# Patient Record
Sex: Male | Born: 1955 | Race: White | Hispanic: No | Marital: Married | Smoking: Never smoker
Health system: Southern US, Community
[De-identification: ages and names within clinical notes are randomized; demographics above are authoritative.]

---

## 2004-09-22 ENCOUNTER — Emergency Department: Payer: Self-pay | Admitting: Emergency Medicine

## 2004-09-22 ENCOUNTER — Other Ambulatory Visit: Payer: Self-pay

## 2004-09-23 ENCOUNTER — Other Ambulatory Visit: Payer: Self-pay

## 2004-09-23 ENCOUNTER — Emergency Department: Payer: Self-pay | Admitting: Emergency Medicine

## 2006-09-11 IMAGING — CT CT ABD-PELV W/O CM
1 of 3 series · 14 of 32 positions shown, 18 images · non-contrast
Comparison: none

REASON FOR EXAM: (1) stone protocol  rm8; (2) stone protocol  rm8
COMMENTS:

[Series 3: inspace · axial · 0.75mm/px · z∈[-1634,-1183]mm · 14 of 705 slices shown, 18 images]
[im 30/705  soft-tissue]
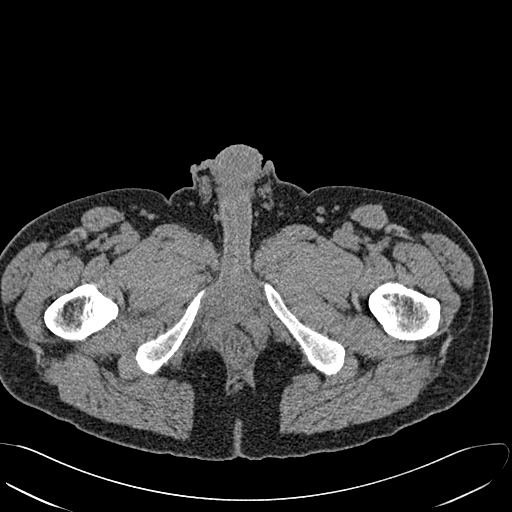
[im 30/705  bone]
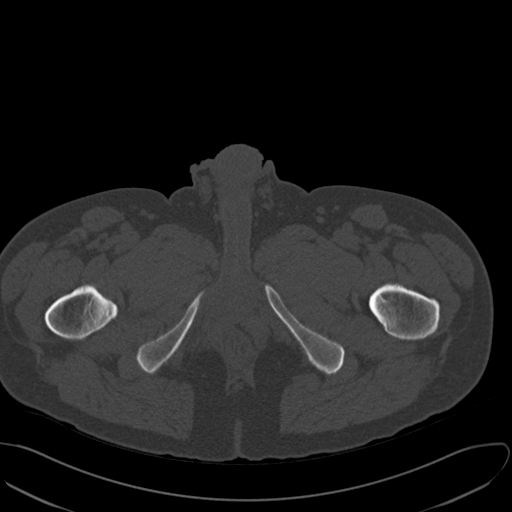
[im 89/705  soft-tissue]
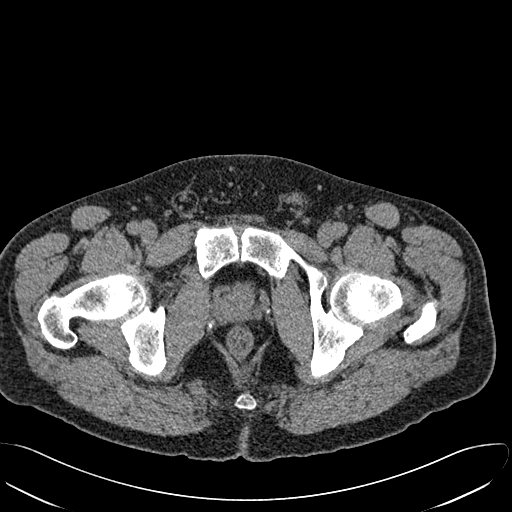
[im 147/705  soft-tissue]
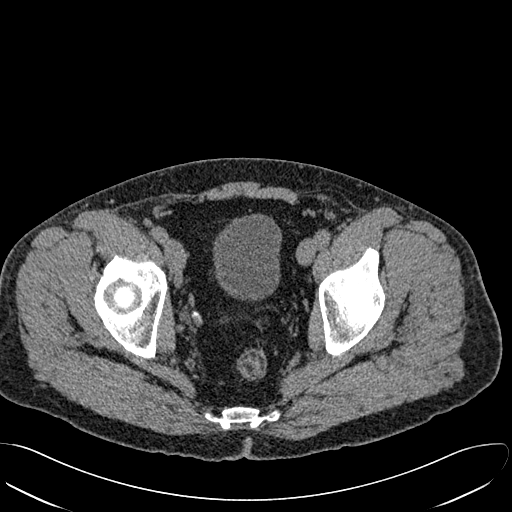
[im 206/705  soft-tissue]
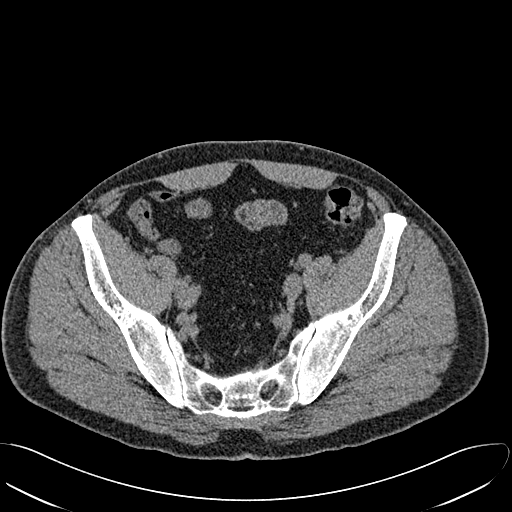
[im 265/705  soft-tissue]
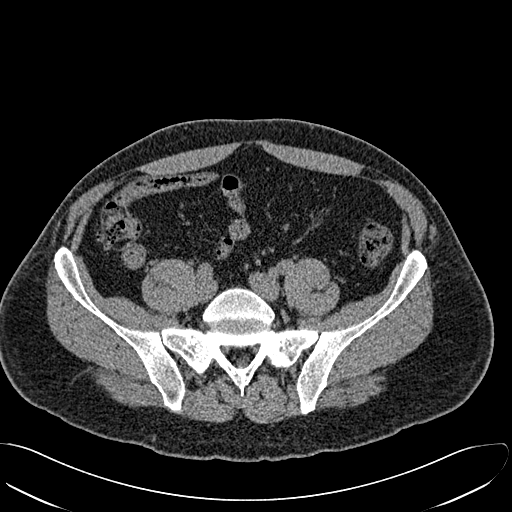
[im 323/705  soft-tissue]
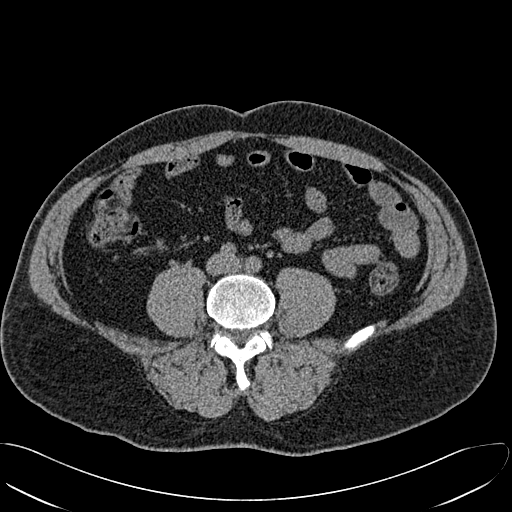
[im 382/705  soft-tissue]
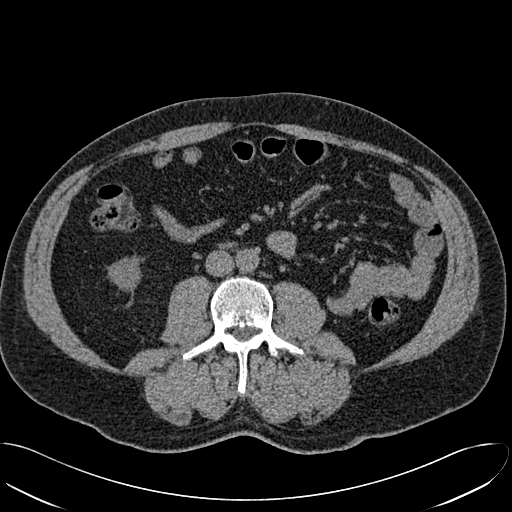
[im 441/705  soft-tissue]
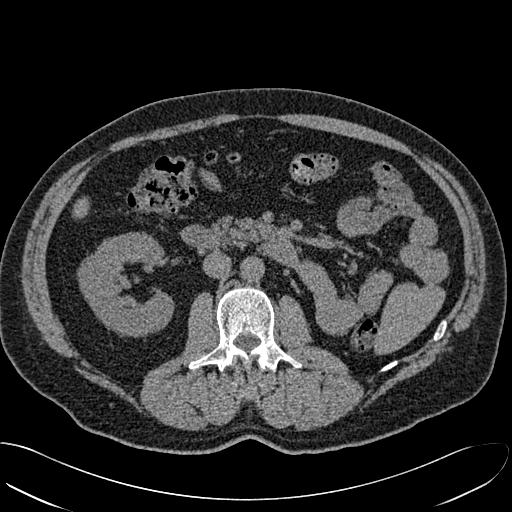
[im 499/705  soft-tissue]
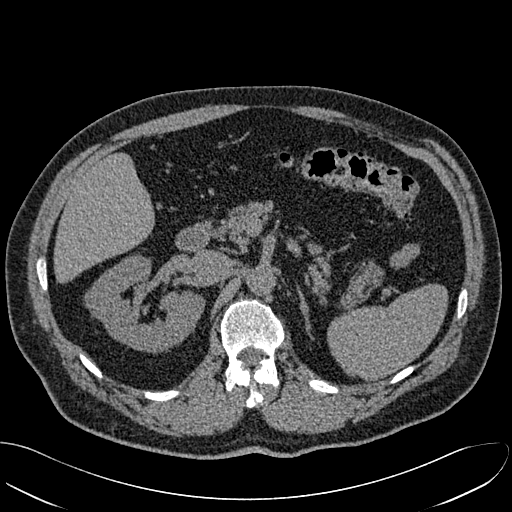
[im 499/705  bone]
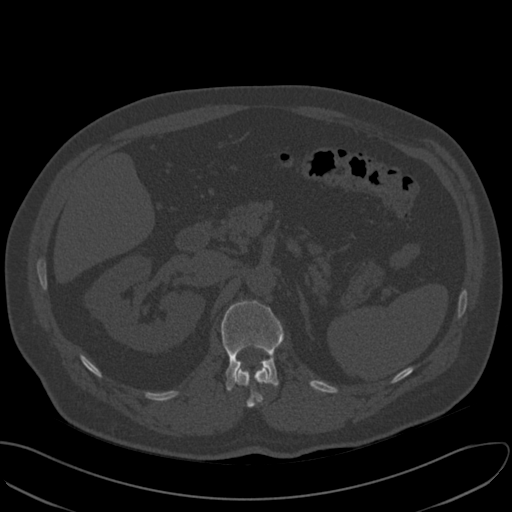
[im 558/705  soft-tissue]
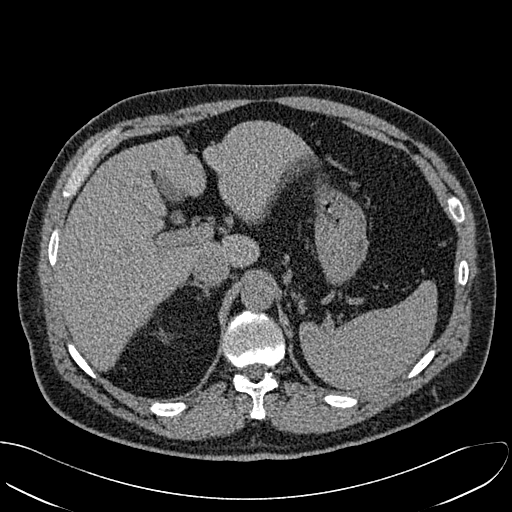
[im 587/705  lung]
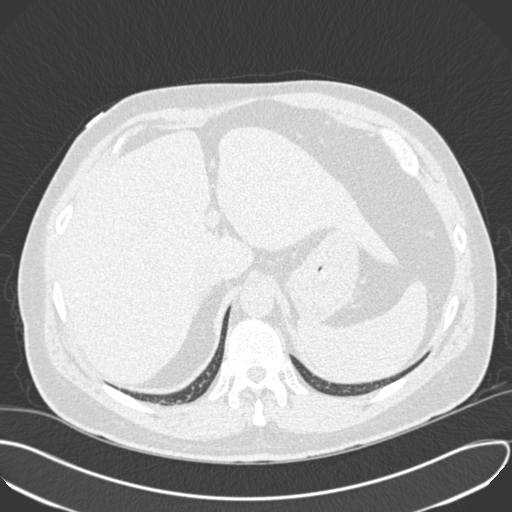
[im 617/705  soft-tissue]
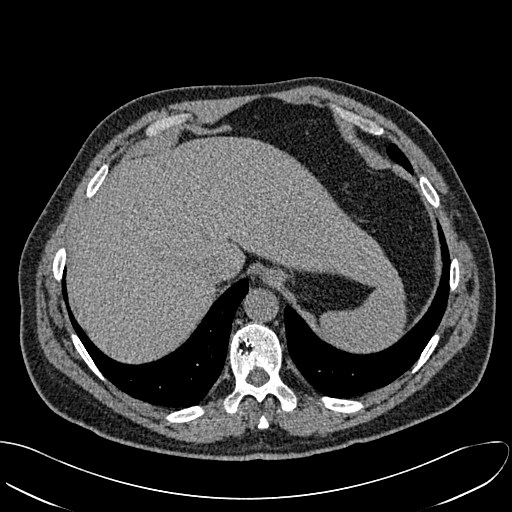
[im 617/705  lung]
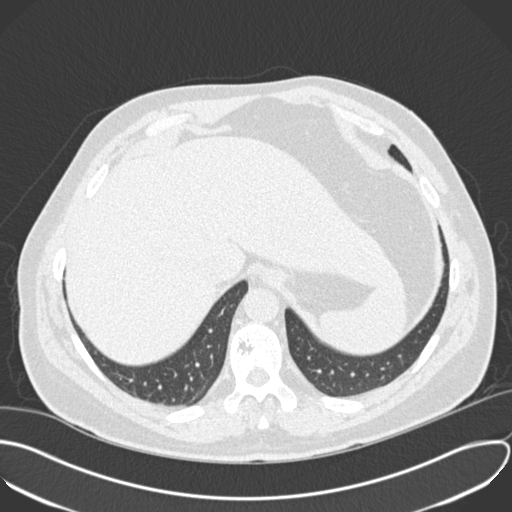
[im 646/705  lung]
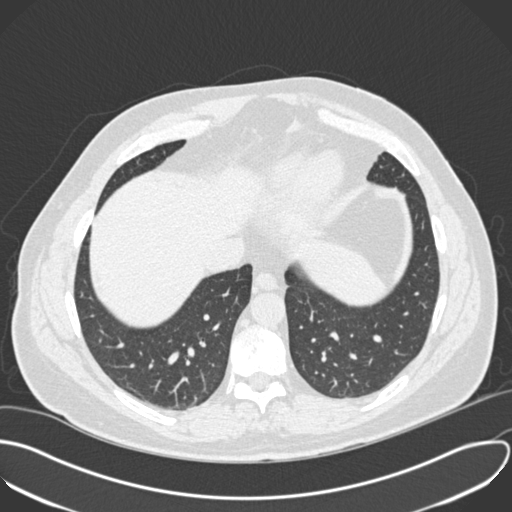
[im 675/705  soft-tissue]
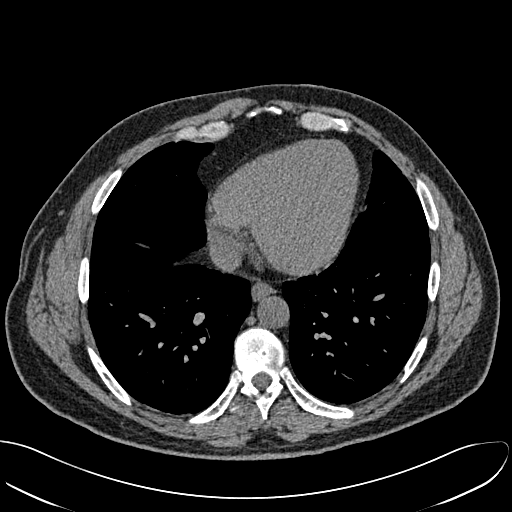
[im 675/705  lung]
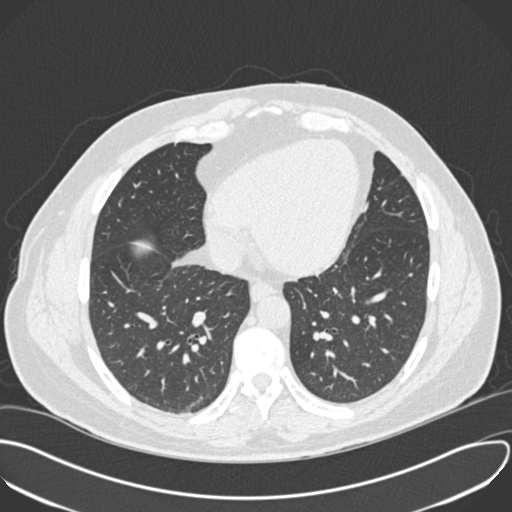

[14 of 32 positions shown; findings below may reference images not displayed]

PROCEDURE:     CT  - CT ABDOMEN AND PELVIS W[DATE]  [DATE]

RESULT:       The study was tailored to evaluate the patient for urinary
tract stones.
The LEFT kidney is absent.  I see no surgical clips;the kidney may be a
congenitally absent.  The LEFT renal fossa is occupied by spleen and a
portion of the pancreas and loops of small bowel.  The adrenal gland appears
larger on the LEFT than on the RIGHT but it is difficult to clearly specify
its margins.  There are prominent splenic vessels adjacent to it.  The RIGHT
kidney exhibits no evidence of hydronephrosis nor of calcified stones.  The
RIGHT ureter appears normal in course and caliber.  A tiny calcific density
in the midline along the extreme anterior superior aspect of the urinary
bladder likely reflects a urachal remnant.  The urinary bladder is partially
distended and grossly normal.  There are phleboliths within the pelvis.  The
prostate gland and seminal vesicles are grossly normal.  No free fluid is
seen in the pelvis. The sigmoid colon is normal in appearance.  The
unopacified loops of small and large bowel appear normal elsewhere as well.

The liver, gallbladder, spleen, pancreas, nondistended stomach, and RIGHT
adrenal gland are normal in appearance.  The para-aortic and pericaval
regions exhibit no finding to suggest lymphadenopathy.  There is no evidence
of an abdominal aortic aneurysm.  At lung window settings, I see no
infiltrate nor atelectasis.
IMPRESSION: 1.     The LEFT kidney is absent.
2.     The RIGHT kidney exhibits compensatory hypertrophy and no evidence of
stones or hydronephrosis nor findings to suggest solid masses.
3.     I see no evidence of hydronephrosis on the RIGHT or hydroureter.
4.     A tiny urachal remnant is present in the midline just above the
anterior aspect of the dome of the urinary bladder.

A preliminary report was faxed to the Emergency Room by Dr. Franz Daniel Avila Fernandez of
the [HOSPITAL]  Group at the conclusion of the study.

## 2011-05-23 ENCOUNTER — Ambulatory Visit: Payer: Self-pay | Admitting: Surgery

## 2012-12-14 IMAGING — US ULTRASOUND RIGHT BREAST
1 series · 8 of 8 positions shown · non-contrast
Comparison: none

REASON FOR EXAM: rt breast nodule below nipple
COMMENTS:

PROCEDURE:     US  - US BREAST RIGHT  - May 23, 2011  [DATE]
RESULT:

[Series 1: ultrasound right breast · 8 of 8 slices shown]
[im 1/8]
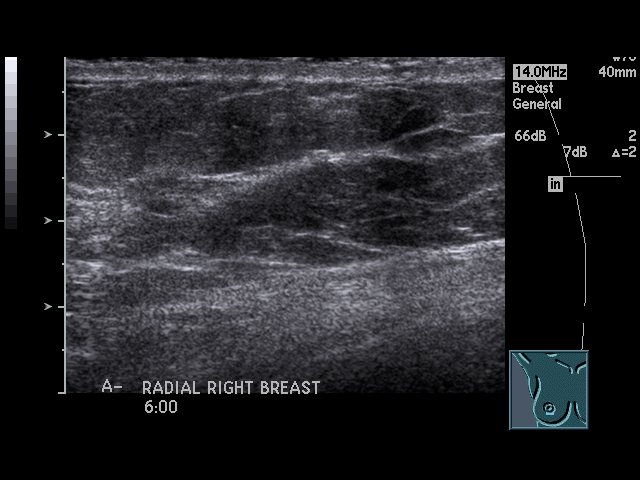
[im 2/8]
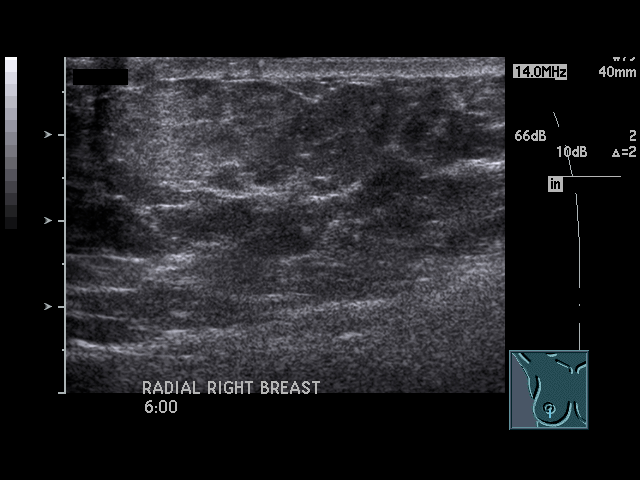
[im 3/8]
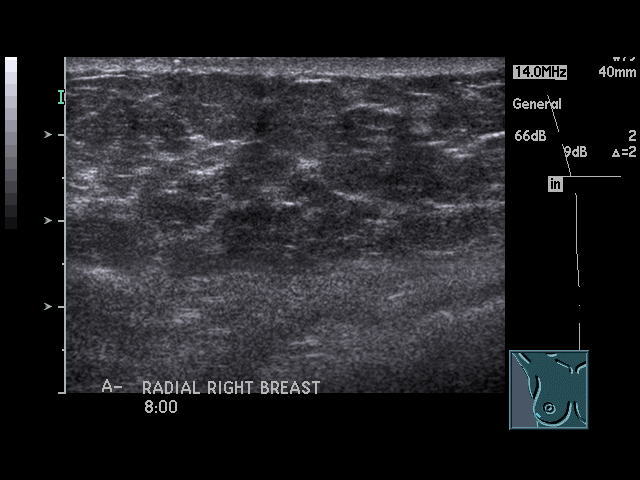
[im 4/8]
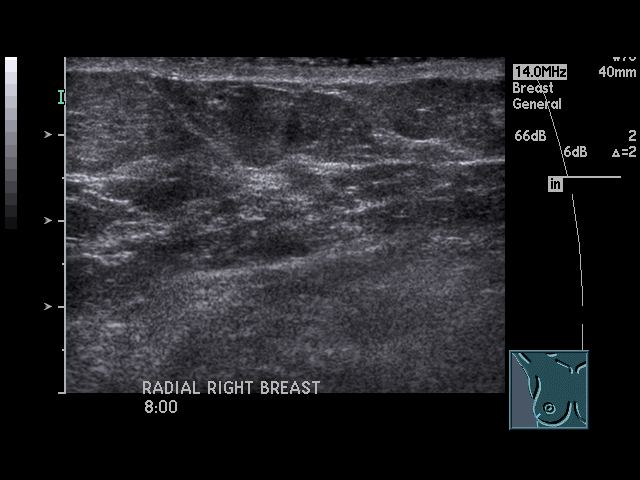
[im 5/8]
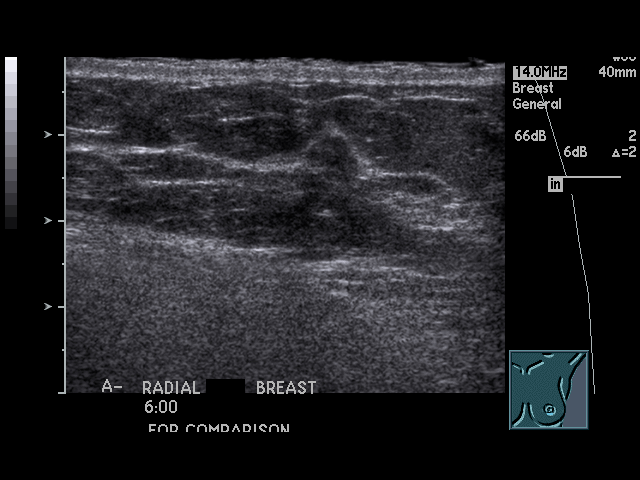
[im 6/8]
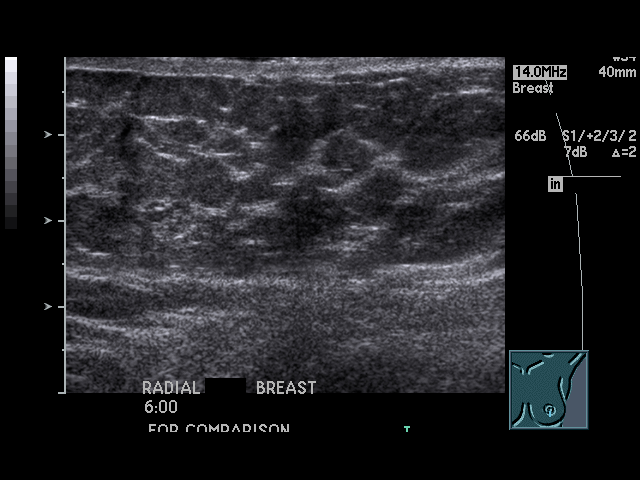
[im 7/8]
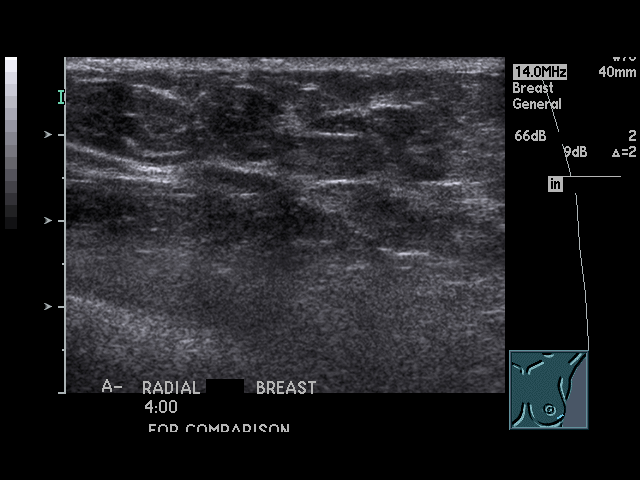
[im 8/8]
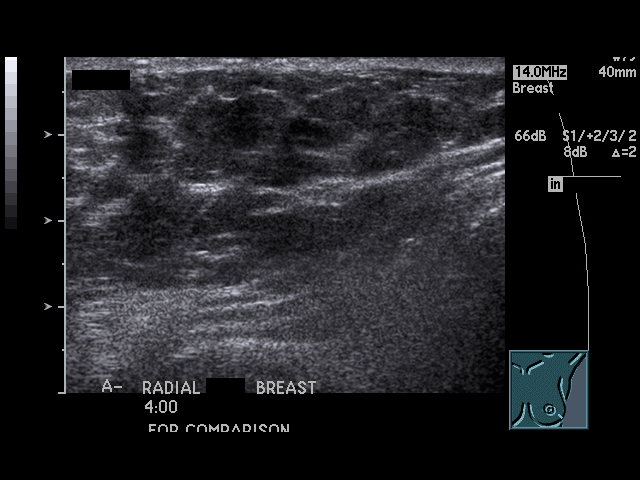

[8 of 8 positions shown; findings below may reference images not displayed]

FINDINGS: Targeted Ultrasound of the right breast is performed in the 6
o'clock and 8 o'clock regions. No solid or cystic abnormality is present.
Comparative sonographic imaging of the left breast shows similar parenchymal
density.
IMPRESSION: No focal sonographic abnormality evident.

## 2013-09-10 ENCOUNTER — Encounter: Payer: Self-pay | Admitting: Urology

## 2013-09-10 LAB — HEMOGLOBIN: HGB: 17.1 g/dL (ref 13.0–18.0)

## 2013-09-10 LAB — HEMATOCRIT: HCT: 50.3 % (ref 40.0–52.0)

## 2013-09-19 ENCOUNTER — Encounter: Payer: Self-pay | Admitting: Urology

## 2014-04-15 DIAGNOSIS — K219 Gastro-esophageal reflux disease without esophagitis: Secondary | ICD-10-CM | POA: Insufficient documentation

## 2014-04-15 DIAGNOSIS — E349 Endocrine disorder, unspecified: Secondary | ICD-10-CM | POA: Insufficient documentation

## 2014-04-15 DIAGNOSIS — E785 Hyperlipidemia, unspecified: Secondary | ICD-10-CM | POA: Insufficient documentation

## 2014-04-15 DIAGNOSIS — I10 Essential (primary) hypertension: Secondary | ICD-10-CM | POA: Insufficient documentation

## 2014-07-11 DIAGNOSIS — M1000 Idiopathic gout, unspecified site: Secondary | ICD-10-CM | POA: Insufficient documentation

## 2016-12-26 ENCOUNTER — Emergency Department
Admission: EM | Admit: 2016-12-26 | Discharge: 2016-12-26 | Disposition: A | Discharge status: Home / Self Care | Payer: BC Managed Care – PPO | Attending: Emergency Medicine | Admitting: Emergency Medicine

## 2016-12-26 ENCOUNTER — Encounter: Payer: Self-pay | Admitting: *Deleted

## 2016-12-26 DIAGNOSIS — I10 Essential (primary) hypertension: Secondary | ICD-10-CM

## 2016-12-26 LAB — CBC
HCT: 48.7 % (ref 40.0–52.0)
Hemoglobin: 16.9 g/dL (ref 13.0–18.0)
MCH: 33.8 pg (ref 26.0–34.0)
MCHC: 34.6 g/dL (ref 32.0–36.0)
MCV: 97.6 fL (ref 80.0–100.0)
Platelets: 178 10*3/uL (ref 150–440)
RBC: 4.99 MIL/uL (ref 4.40–5.90)
RDW: 13.7 % (ref 11.5–14.5)
WBC: 7.6 10*3/uL (ref 3.8–10.6)

## 2016-12-26 LAB — BASIC METABOLIC PANEL
Anion gap: 11 (ref 5–15)
BUN: 18 mg/dL (ref 6–20)
CO2: 27 mmol/L (ref 22–32)
Calcium: 9.6 mg/dL (ref 8.9–10.3)
Chloride: 100 mmol/L — ABNORMAL LOW (ref 101–111)
Creatinine, Ser: 1.56 mg/dL — ABNORMAL HIGH (ref 0.61–1.24)
GFR calc Af Amer: 54 mL/min — ABNORMAL LOW (ref >60)
GFR calc non Af Amer: 47 mL/min — ABNORMAL LOW (ref >60)
Glucose, Bld: 142 mg/dL — ABNORMAL HIGH (ref 65–99)
Potassium: 3.7 mmol/L (ref 3.5–5.1)
Sodium: 138 mmol/L (ref 135–145)

## 2016-12-26 LAB — BRAIN NATRIURETIC PEPTIDE: B Natriuretic Peptide: 54 pg/mL (ref 0.0–100.0)

## 2016-12-26 LAB — TROPONIN I: Troponin I: <0.03 ng/mL (ref <0.03)

## 2016-12-26 MED ORDER — METOPROLOL TARTRATE 50 MG PO TABS
50.0000 mg | ORAL_TABLET | Freq: Once | ORAL | Status: AC
  Administered 2016-12-26: 50 mg via ORAL
  Filled 2016-12-26: qty 1

## 2016-12-26 MED ORDER — METOPROLOL TARTRATE 50 MG PO TABS: 50.0000 mg | ORAL_TABLET | Freq: Two times a day (BID) | ORAL | 0 refills | Status: DC

## 2016-12-26 NOTE — Discharge Instructions (Signed)
Stop taking the toprol and switch to lopressor 50mg  twice a day. Take this dose for a few days and if your blood pressure is still elevated talk to your doctor about increasing the dosage. Keep a diary with your blood pressure and heart rate. Return to the ER of you develop chest pain, shortness of breath, severe headache or any other new symptoms concerning to you.

## 2016-12-26 NOTE — ED Triage Notes (Signed)
Pt reports high blood pressure and swollen feet.  Sx began yesterday.  Pt taking bp meds.  No chest pain or sob. Pt alert.

## 2016-12-26 NOTE — ED Provider Notes (Signed)
[facility] Emergency Department Provider Note  ____________________________________________  Time seen: Approximately 8:01 PM  I have reviewed the triage vital signs and the nursing notes.   HISTORY  Chief Complaint Hypertension   HPI AVISH TORRY is a 61 y.o. male with history of hypertension, hyperlipidemia, diabetes, and GERD who presents for evaluation of elevated blood pressure. Patient went to see his primary care doctor yesterday for elevated blood pressure and lower extremity edema. No changes on his medications were done. Doctor wanted patient to check his blood pressure for 2 weeks before making any changes to patient's current regimen.Patient reports that today he wasn't feeling well had some pressure in his head and tightness in his chest which prompted him to check his blood pressure. 2 reads at home that were above 314 systolic. He has been compliant with his antihypertensives. He denies any headache or chest pain at this time, no shortness of breath, no orthopnea. Patient reports that he stands on his feet 12 hours a day for work and has had progressively worsening swelling of his lower extremities for the last week. He denies any history of heart failure.  No past medical history on file.  There are no active problems to display for this patient.   No past surgical history on file.  Prior to Admission medications   Medication Sig Start Date End Date Taking? Authorizing Provider  metoprolol tartrate (LOPRESSOR) 50 MG tablet Take 1 tablet (50 mg total) by mouth 2 (two) times daily. 12/26/16 12/26/17  Rudene Re, MD    Allergies Penicillins  No family history on file.  Social History Social History  Substance Use Topics  . Smoking status: Never Smoker  . Smokeless tobacco: Never Used  . Alcohol use No    Review of Systems  Constitutional: Negative for fever. Eyes: Negative for visual changes. ENT: Negative for sore  throat. Neck: No neck pain  Cardiovascular: Negative for chest pain. Respiratory: Negative for shortness of breath. Gastrointestinal: Negative for abdominal pain, vomiting or diarrhea. Genitourinary: Negative for dysuria. Musculoskeletal: Negative for back pain. + b/l LE edema Skin: Negative for rash. Neurological: Negative for headaches, weakness or numbness. Psych: No SI or HI  ____________________________________________   PHYSICAL EXAM:  VITAL SIGNS: ED Triage Vitals  Enc Vitals Group     BP 12/26/16 1910 (!) 207/117     Pulse Rate 12/26/16 1910 94     Resp 12/26/16 1910 20     Temp 12/26/16 1910 99.1 F (37.3 C)     Temp Source 12/26/16 1910 Oral     SpO2 12/26/16 1910 99 %     Weight 12/26/16 1907 237 lb (107.5 kg)     Height 12/26/16 1907 5\' 9"  (1.753 m)     Head Circumference --      Peak Flow --      Pain Score --      Pain Loc --      Pain Edu? --      Excl. in [place]? --     Constitutional: Alert and oriented. Well appearing and in no apparent distress. HEENT:      Head: Normocephalic and atraumatic.         Eyes: Conjunctivae are normal. Sclera is non-icteric. EOMI. PERRL      Mouth/Throat: Mucous membranes are moist.       Neck: Supple with no signs of meningismus. Cardiovascular: Regular rate and rhythm. No murmurs, gallops, or rubs. 2+ symmetrical distal pulses are present  in all extremities. No JVD. Respiratory: Normal respiratory effort. Lungs are clear to auscultation bilaterally. No wheezes, crackles, or rhonchi.  Gastrointestinal: Soft, non tender, and non distended with positive bowel sounds. No rebound or guarding. Musculoskeletal: 2+ pitting edema on b/l LE Neurologic: Normal speech and language. A & O x3, PERRL, no nystagmus, CN II-XII intact, motor testing reveals good tone and bulk throughout. There is no evidence of pronator drift or dysmetria. Muscle strength is 5/5 throughout. Deep tendon reflexes are 2+ throughout with downgoing toes. Sensory  examination is intact. Gait is normal. Skin: Skin is warm, dry and intact. No rash noted. Psychiatric: Mood and affect are normal. Speech and behavior are normal.  ____________________________________________   LABS (all labs ordered are listed, but only abnormal results are displayed)  Labs Reviewed  BASIC METABOLIC PANEL - Abnormal; Notable for the following:       Result Value   Chloride 100 (*)    Glucose, Bld 142 (*)    Creatinine, Ser 1.56 (*)    GFR calc non Af Amer 47 (*)    GFR calc Af Amer 54 (*)    All other components within normal limits  CBC  BRAIN NATRIURETIC PEPTIDE  TROPONIN I   ____________________________________________  EKG  ED ECG REPORT I, Rudene Re, the attending physician, personally viewed and interpreted this ECG.  Sinus rhythm, rate of 92, normal intervals, normal axis, no ST elevations or depressions. ____________________________________________  RADIOLOGY  none ____________________________________________   PROCEDURES  Procedure(s) performed: None Procedures Critical Care performed:  None ____________________________________________   INITIAL IMPRESSION / ASSESSMENT AND PLAN / ED COURSE   61 y.o. male with history of hypertension, hyperlipidemia, diabetes, and GERD who presents for evaluation of elevated blood pressure and b/l LE edema. Patient does have 2+ pitting edema but no other evidence of heart failure with clear lungs and normal JVD. We'll check a BNP. Patient is currently on benazepril, Toprol 100 mg, and amlodipine. 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 Will switch patient from toprol 100mg  QD to lopressor 50mg  BID as patient's HR has room for increased beta blockade  Clinical Course as of Dec 26 2013  Thu Dec 26, 2016  2011 Labs and no acute findings. Patient's creatinine is at his baseline of 1.5, BNP is negative. Troponin is negative.  Plan to discharge home with new beta blocker regimen and close follow-up with primary care doctor.  [CV]    Clinical Course User Index [CV] Rudene Re, MD    Pertinent labs & imaging results that were available during my care of the patient were reviewed by me and considered in my medical decision making (see chart for details).    ____________________________________________   FINAL CLINICAL IMPRESSION(S) / ED DIAGNOSES  Final diagnoses:  Essential hypertension      NEW MEDICATIONS STARTED DURING THIS VISIT:  New Prescriptions   METOPROLOL TARTRATE (LOPRESSOR) 50 MG TABLET    Take 1 tablet (50 mg total) by mouth 2 (two) times daily.     Note:  This document was prepared using Dragon voice recognition software and may include unintentional dictation errors.    Rudene Re, MD 12/26/16 Thom Chimes

## 2017-01-10 ENCOUNTER — Other Ambulatory Visit (INDEPENDENT_AMBULATORY_CARE_PROVIDER_SITE_OTHER): Payer: Self-pay | Admitting: Family Medicine

## 2017-01-10 DIAGNOSIS — I10 Essential (primary) hypertension: Secondary | ICD-10-CM

## 2017-01-14 ENCOUNTER — Ambulatory Visit (INDEPENDENT_AMBULATORY_CARE_PROVIDER_SITE_OTHER): Payer: BC Managed Care – PPO

## 2017-01-14 DIAGNOSIS — I10 Essential (primary) hypertension: Secondary | ICD-10-CM

## 2021-01-31 DIAGNOSIS — Z85828 Personal history of other malignant neoplasm of skin: Secondary | ICD-10-CM | POA: Insufficient documentation

## 2021-01-31 DIAGNOSIS — Z9889 Other specified postprocedural states: Secondary | ICD-10-CM | POA: Insufficient documentation

## 2021-03-13 DIAGNOSIS — N289 Disorder of kidney and ureter, unspecified: Secondary | ICD-10-CM | POA: Insufficient documentation

## 2021-03-13 DIAGNOSIS — N401 Enlarged prostate with lower urinary tract symptoms: Secondary | ICD-10-CM | POA: Insufficient documentation

## 2022-05-21 DIAGNOSIS — N183 Chronic kidney disease, stage 3 unspecified: Secondary | ICD-10-CM | POA: Diagnosis not present

## 2022-05-21 DIAGNOSIS — E1122 Type 2 diabetes mellitus with diabetic chronic kidney disease: Secondary | ICD-10-CM | POA: Diagnosis not present

## 2022-05-21 DIAGNOSIS — E782 Mixed hyperlipidemia: Secondary | ICD-10-CM | POA: Diagnosis not present

## 2022-05-28 ENCOUNTER — Other Ambulatory Visit: Payer: Self-pay | Admitting: Urology

## 2022-05-28 DIAGNOSIS — K219 Gastro-esophageal reflux disease without esophagitis: Secondary | ICD-10-CM | POA: Diagnosis not present

## 2022-05-28 DIAGNOSIS — I10 Essential (primary) hypertension: Secondary | ICD-10-CM | POA: Diagnosis not present

## 2022-05-28 DIAGNOSIS — E1122 Type 2 diabetes mellitus with diabetic chronic kidney disease: Secondary | ICD-10-CM | POA: Diagnosis not present

## 2022-05-28 DIAGNOSIS — N183 Chronic kidney disease, stage 3 unspecified: Secondary | ICD-10-CM | POA: Diagnosis not present

## 2022-05-28 DIAGNOSIS — C61 Malignant neoplasm of prostate: Secondary | ICD-10-CM | POA: Diagnosis not present

## 2022-05-28 DIAGNOSIS — E782 Mixed hyperlipidemia: Secondary | ICD-10-CM | POA: Diagnosis not present

## 2022-05-28 DIAGNOSIS — M1A09X0 Idiopathic chronic gout, multiple sites, without tophus (tophi): Secondary | ICD-10-CM | POA: Diagnosis not present

## 2022-05-28 DIAGNOSIS — R972 Elevated prostate specific antigen [PSA]: Secondary | ICD-10-CM

## 2022-06-11 ENCOUNTER — Ambulatory Visit: Payer: BC Managed Care – PPO

## 2022-06-18 ENCOUNTER — Ambulatory Visit
Admission: RE | Admit: 2022-06-18 | Discharge: 2022-06-18 | Disposition: A | Discharge status: Home / Self Care | Payer: PPO | Source: Ambulatory Visit | Attending: Urology | Admitting: Urology

## 2022-06-18 DIAGNOSIS — R972 Elevated prostate specific antigen [PSA]: Secondary | ICD-10-CM | POA: Insufficient documentation

## 2022-06-18 DIAGNOSIS — K5730 Diverticulosis of large intestine without perforation or abscess without bleeding: Secondary | ICD-10-CM | POA: Diagnosis not present

## 2022-06-18 MED ORDER — GADOBUTROL 1 MMOL/ML IV SOLN
10.0000 mL | Freq: Once | INTRAVENOUS | Status: AC | PRN
  Administered 2022-06-18: 10 mL via INTRAVENOUS

## 2022-06-21 DIAGNOSIS — C61 Malignant neoplasm of prostate: Secondary | ICD-10-CM | POA: Diagnosis not present

## 2022-06-21 DIAGNOSIS — R972 Elevated prostate specific antigen [PSA]: Secondary | ICD-10-CM | POA: Diagnosis not present

## 2022-08-14 DIAGNOSIS — N2581 Secondary hyperparathyroidism of renal origin: Secondary | ICD-10-CM | POA: Diagnosis not present

## 2022-08-14 DIAGNOSIS — R809 Proteinuria, unspecified: Secondary | ICD-10-CM | POA: Diagnosis not present

## 2022-08-14 DIAGNOSIS — R600 Localized edema: Secondary | ICD-10-CM | POA: Diagnosis not present

## 2022-08-14 DIAGNOSIS — I10 Essential (primary) hypertension: Secondary | ICD-10-CM | POA: Diagnosis not present

## 2022-08-14 DIAGNOSIS — N1831 Chronic kidney disease, stage 3a: Secondary | ICD-10-CM | POA: Diagnosis not present

## 2022-10-22 ENCOUNTER — Encounter: Payer: Self-pay | Admitting: Urology

## 2022-10-22 ENCOUNTER — Ambulatory Visit (INDEPENDENT_AMBULATORY_CARE_PROVIDER_SITE_OTHER): Payer: HMO | Admitting: Urology

## 2022-10-22 VITALS — BP 146/88 | HR 65 | Ht 69.0 in | Wt 263.0 lb

## 2022-10-22 DIAGNOSIS — C61 Malignant neoplasm of prostate: Secondary | ICD-10-CM

## 2022-10-22 DIAGNOSIS — N189 Chronic kidney disease, unspecified: Secondary | ICD-10-CM

## 2022-10-22 DIAGNOSIS — N400 Enlarged prostate without lower urinary tract symptoms: Secondary | ICD-10-CM

## 2022-10-22 DIAGNOSIS — Q600 Renal agenesis, unilateral: Secondary | ICD-10-CM | POA: Diagnosis not present

## 2022-10-22 DIAGNOSIS — Z87442 Personal history of urinary calculi: Secondary | ICD-10-CM

## 2022-10-22 LAB — MICROSCOPIC EXAMINATION

## 2022-10-22 LAB — URINALYSIS, COMPLETE
Bilirubin, UA: NEGATIVE
Glucose, UA: NEGATIVE
Ketones, UA: NEGATIVE
Nitrite, UA: NEGATIVE
RBC, UA: NEGATIVE
Specific Gravity, UA: 1.02 (ref 1.005–1.030)
Urobilinogen, Ur: 0.2 mg/dL (ref 0.2–1.0)
pH, UA: 7 (ref 5.0–7.5)

## 2022-10-22 LAB — BLADDER SCAN AMB NON-IMAGING

## 2022-10-22 MED ORDER — POTASSIUM CITRATE ER 15 MEQ (1620 MG) PO TBCR: 1.0000 | EXTENDED_RELEASE_TABLET | Freq: Two times a day (BID) | ORAL | 3 refills | Status: DC

## 2022-10-22 MED ORDER — ALLOPURINOL 100 MG PO TABS: 100.0000 mg | ORAL_TABLET | Freq: Every day | ORAL | 3 refills | Status: DC

## 2022-10-22 NOTE — Progress Notes (Signed)
Haze Rushing Plume,acting as a scribe for Hollice Espy, MD.,have documented all relevant documentation on the behalf of Hollice Espy, MD,as directed by  Hollice Espy, MD while in the presence of Hollice Espy, MD.' 10/22/2022 9:51 AM   Thomas Cooke 24-Apr-1956 [id number]  Referring provider: Sofie Hartigan, MD [address],  [place] [address]  Chief Complaint  Patient presents with   Establish Care   Benign Prostatic Hypertrophy    HPI: 67 year-old male transferring care from Dr. Yves Dill with a personal history of T1C low risk prostate cancer, and active surveillance.   He was diagnosed with Gleason 3+3 prostate cancer in 07/2021. He elected for active surveillance at that time. He had a prostate MRI in 04/27/2021 at [facility] that showed PIRADS 4 lesion in the left anterior extending to the mid prostate. He had a fusion biopsy at [facility] in the area of interest, which also represented Gleason 3+3 along with 4 additional cores. Prostate volume was 19 cc.  His PSA at the time of diagnosis was 4.1.   He had a repeat MRI of his prostate on 06/18/2022 at [facility] which showed a PIRADS 2 lesion.  Most recent PSA was 6.3.  Today, he reports minimal urinary symptoms. He states that he has as history of kidney stones and drinks plenty of fluids. He has lower extremity edema and has worn compression tights in the past but finds them uncomfortable. He reports that his last measurable kidney stone was 8-9 years ago. He would like a refill of Allopurinol, which was prescribed by Dr. Yves Dill to manage his uric acid levels.   He notes that he does pass very small stones occasionally and without incident.    Results for orders placed or performed in visit on 10/22/22  Microscopic Examination   Urine  Result Value Ref Range   WBC, UA 11-30 (A) 0 - 5 /hpf   RBC, Urine 0-2 0 - 2 /hpf   Epithelial Cells (non renal) 0-10 0 - 10 /hpf   Casts Present (A) None seen /lpf   Cast Type Hyaline casts  N/A   Bacteria, UA Many (A) None seen/Few  Urinalysis, Complete  Result Value Ref Range   Specific Gravity, UA 1.020 1.005 - 1.030   pH, UA 7.0 5.0 - 7.5   Color, UA Yellow Yellow   Appearance Ur Clear Clear   Leukocytes,UA Trace (A) Negative   Protein,UA 3+ (A) Negative/Trace   Glucose, UA Negative Negative   Ketones, UA Negative Negative   RBC, UA Negative Negative   Bilirubin, UA Negative Negative   Urobilinogen, Ur 0.2 0.2 - 1.0 mg/dL   Nitrite, UA Negative Negative   Microscopic Examination See below:   Bladder Scan (Post Void Residual) in office  Result Value Ref Range   Scan Result 65m     IPSS     Row Name 10/22/22 0800         International Prostate Symptom Score   How often have you had the sensation of not emptying your bladder? Less than half the time     How often have you had to urinate less than every two hours? Less than half the time     How often have you found you stopped and started again several times when you urinated? Not at All     How often have you found it difficult to postpone urination? Less than 1 in 5 times     How often have you  had a weak urinary stream? Not at All     How often have you had to strain to start urination? Not at All     How many times did you typically get up at night to urinate? 2 Times     Total IPSS Score 7       Quality of Life due to urinary symptoms   If you were to spend the rest of your life with your urinary condition just the way it is now how would you feel about that? Mostly Satisfied              Score:  1-7 Mild 8-19 Moderate 20-35 Severe   Home Medications:  Allergies as of 10/22/2022       Reactions   Penicillins Rash        Medication List        Accurate as of October 22, 2022  9:51 AM. If you have any questions, ask your nurse or doctor.          allopurinol 100 MG tablet Commonly known as: ZYLOPRIM Take 1 tablet (100 mg total) by mouth daily.   amLODipine 10 MG tablet Commonly  known as: NORVASC Take 10 mg by mouth daily.   atorvastatin 10 MG tablet Commonly known as: LIPITOR Take 10 mg by mouth at bedtime.   benazepril 40 MG tablet Commonly known as: LOTENSIN Take by mouth.   chlorthalidone 50 MG tablet Commonly known as: HYGROTON Take 50 mg by mouth daily.   metoprolol tartrate 50 MG tablet Commonly known as: LOPRESSOR Take 1 tablet (50 mg total) by mouth 2 (two) times daily.   Potassium Citrate 15 MEQ (1620 MG) Tbcr Take 1 tablet by mouth in the morning and at bedtime. Started by: Hollice Espy, MD        Allergies:  Allergies  Allergen Reactions   Penicillins Rash    Social History:  reports that he has never smoked. He has never used smokeless tobacco. He reports that he does not drink alcohol and does not use drugs.   Physical Exam: BP (!) 146/88   Pulse 65   Ht '5\' 9"'$  (1.753 m)   Wt 263 lb (119.3 kg)   BMI 38.84 kg/m   Constitutional:  Alert and oriented, No acute distress. HEENT: [place] AT, moist mucus membranes.  Trachea midline, no masses. GU: 20 gram prostate and non-tender. No nodules, Neurologic: Grossly intact, no focal deficits, moving all 4 extremities. Psychiatric: Normal mood and affect.  Pertinent imaging:  CLINICAL INDICATION: Rising PSA and FH of prostate cancer  - R97.20 - Elevated PSA    COMPARISON: CT abdomen and pelvis 04/27/2021.   TECHNIQUE: MRI of the pelvis was performed with and without intravenous gadolinium contrast material. Multisequence, multiplanar images were obtained. Following scanning, 3D post-processing was performed at an independent workstation under concurrent radiologist supervision.   FINDINGS:   The prostate gland measures: 4.3 x 2.5 x 3.7 cm (gland volume of 19 mL).   Note is made of the following lesion(s):   Lesion: 1  Series / Image: T2 image 14  Side: Left  Craniocaudal location: Apex to mid  Anteroposterior location: Anterior  Medial / lateral location: Medial  Zonal  involvement: Peripheral, closely abutting the transition zone  Size: 1.1 x 0.3 x 0.9 cm  Morphology: Lentiform  Margins: Poorly defined  Signal intensity on T2w imaging: Hypointense  Visible on ADC?: Yes  Visible on b-1600?: Yes  Hypervascular?: No  Length of capsular  contact: Less than 1 cm  Specific sign(s) of extracapsular disease?: No  Right seminal vesicle: No invasion  Left seminal vesicle: No invasion  Bladder neck: No invasion  Membranous urethra: No invasion  PI-RADS: 4   There is mild BPH. Prominent extruded BPH nodule in the in the posterior transition zone near the apex measuring 1.2 x 1.1 cm (12:15). The median lobe is not significantly enlarged.   SEMINAL VESICLES: Normal in appearance.   BLADDER: Mild circumferential bladder wall thickening with subtle trabeculation (5:6), although likely accentuated by underdistention.   LYMPH NODES: No adenopathy by size criteria.   BONES/SOFT TISSUES: No aggressive osseous lesions. Small fat-containing right inguinal hernia. Partially rim-enhancing fluid collection in the right iliopsoas musculature extending to the myotendinous junction measuring approximately 1.3 x 0.8 cm (15:45).   IMPRESSION:  PI RADS 4 lesion within the left anterior apex to mid prostate, as above. No discrete extracapsular extension although the lesion closely abuts the anterior border of the prostate. Dynacad imaging sent to PACS.   Mildly peripherally enhancing fluid collection in the right iliopsoas muscle, which is likely chronic and benign, although intramuscular abscess cannot be definitively excluded. Recommend correlation with physical exam.   Mild BPH with normal calculated volume of the prostate gland.   Mild circumferential bladder wall thickening and trabeculation, which can be seen in the setting of chronic bladder outlet obstruction.    Payton Spark, MD - 04/27/2021   This was reviewed and I agree with the radiologic  interpretation.   Narrative & Impression  CLINICAL DATA:  Elevated PSA level of 4.6 on 02/01/2022.  R97.20   EXAM: MR PROSTATE WITHOUT AND WITH CONTRAST   TECHNIQUE: Multiplanar multisequence MRI images were obtained of the pelvis centered about the prostate. Pre and post contrast images were obtained.   CONTRAST:  1m GADAVIST GADOBUTROL 1 MMOL/ML IV SOLN   COMPARISON:  CT pelvis 09/24/2004   FINDINGS: Prostate:   Substantial hazy low T2 signal in the peripheral zone is nonfocal, likely postinflammatory, and is considered PI-RADS category 2.   Prominent median lobe indents the bladder base.   Small foci of lateral extraprostatic restricted diffusion are likely incidental.   No focal lesion of intermediate or higher suspicion for prostate cancer is apparent on today's MRI examination.   Volume: 3D volumetric analysis: Prostate volume 26.18 cc (4.1 by 3.1 by 4.9 cm).   Transcapsular spread:  Absent   Seminal vesicle involvement: Absent   Neurovascular bundle involvement: Absent   Pelvic adenopathy: Absent   Bone metastasis: Absent   Other findings: Scattered sigmoid colon diverticula.   IMPRESSION: 1. No focal lesion of intermediate or higher suspicion for prostate cancer is apparent on today's MRI examination. 2. Substantial hazy low T2 signal in the peripheral zone is nonfocal, likely postinflammatory, and is considered PI-RADS category 2. 3. Prominent median lobe indents the bladder base. 4. Scattered sigmoid colon diverticula.     Electronically Signed   By: WVan ClinesM.D.   On: 06/19/2022 10:31  This was reviewed and I agree with the radiologic interpretation.    Assessment & Plan:    1. Prostate cancer - Low risk on active surveillance. - Rectal exam was benign. - We will continue active surveillance for the time being.  - PSA only in 6 months. PSA and DRE in a year.   2. History of kidney stones.  - He is being managed on  potassium citrate 15 mg Eq twice daily and Allopurinol. We  will continue these prescription and take over managing these medications.  - He is going to continue to hydrate adequately.  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 - Followed by Dr. Holley Raring in nephrology.  Return in about 6 months (around 04/24/2023) for repeat PSA. PSA and DRE in 1 year.  I have reviewed the above documentation for accuracy and completeness, and I agree with the above.   Hollice Espy, MD    [facility] [address] [place], [place] [address] [phone]

## 2022-10-23 LAB — PSA: Prostate Specific Ag, Serum: 6.2 ng/mL — ABNORMAL HIGH (ref 0.0–4.0)

## 2022-12-02 DIAGNOSIS — E1122 Type 2 diabetes mellitus with diabetic chronic kidney disease: Secondary | ICD-10-CM | POA: Diagnosis not present

## 2022-12-02 DIAGNOSIS — E782 Mixed hyperlipidemia: Secondary | ICD-10-CM | POA: Diagnosis not present

## 2022-12-02 DIAGNOSIS — N183 Chronic kidney disease, stage 3 unspecified: Secondary | ICD-10-CM | POA: Diagnosis not present

## 2022-12-09 DIAGNOSIS — C61 Malignant neoplasm of prostate: Secondary | ICD-10-CM | POA: Diagnosis not present

## 2022-12-09 DIAGNOSIS — Z0000 Encounter for general adult medical examination without abnormal findings: Secondary | ICD-10-CM | POA: Diagnosis not present

## 2022-12-09 DIAGNOSIS — N1832 Chronic kidney disease, stage 3b: Secondary | ICD-10-CM | POA: Diagnosis not present

## 2022-12-09 DIAGNOSIS — K219 Gastro-esophageal reflux disease without esophagitis: Secondary | ICD-10-CM | POA: Diagnosis not present

## 2022-12-09 DIAGNOSIS — E1122 Type 2 diabetes mellitus with diabetic chronic kidney disease: Secondary | ICD-10-CM | POA: Diagnosis not present

## 2022-12-09 DIAGNOSIS — I10 Essential (primary) hypertension: Secondary | ICD-10-CM | POA: Diagnosis not present

## 2022-12-09 DIAGNOSIS — N2581 Secondary hyperparathyroidism of renal origin: Secondary | ICD-10-CM | POA: Diagnosis not present

## 2022-12-09 DIAGNOSIS — Z136 Encounter for screening for cardiovascular disorders: Secondary | ICD-10-CM | POA: Diagnosis not present

## 2022-12-09 DIAGNOSIS — E782 Mixed hyperlipidemia: Secondary | ICD-10-CM | POA: Diagnosis not present

## 2022-12-09 DIAGNOSIS — M1A09X0 Idiopathic chronic gout, multiple sites, without tophus (tophi): Secondary | ICD-10-CM | POA: Diagnosis not present

## 2022-12-09 DIAGNOSIS — N1831 Chronic kidney disease, stage 3a: Secondary | ICD-10-CM | POA: Diagnosis not present

## 2022-12-19 DIAGNOSIS — N2581 Secondary hyperparathyroidism of renal origin: Secondary | ICD-10-CM | POA: Diagnosis not present

## 2022-12-19 DIAGNOSIS — R809 Proteinuria, unspecified: Secondary | ICD-10-CM | POA: Diagnosis not present

## 2022-12-19 DIAGNOSIS — N1832 Chronic kidney disease, stage 3b: Secondary | ICD-10-CM | POA: Diagnosis not present

## 2022-12-19 DIAGNOSIS — I10 Essential (primary) hypertension: Secondary | ICD-10-CM | POA: Diagnosis not present

## 2022-12-19 DIAGNOSIS — R600 Localized edema: Secondary | ICD-10-CM | POA: Diagnosis not present

## 2023-03-06 DIAGNOSIS — R809 Proteinuria, unspecified: Secondary | ICD-10-CM | POA: Diagnosis not present

## 2023-03-06 DIAGNOSIS — N1832 Chronic kidney disease, stage 3b: Secondary | ICD-10-CM | POA: Diagnosis not present

## 2023-03-06 DIAGNOSIS — N2581 Secondary hyperparathyroidism of renal origin: Secondary | ICD-10-CM | POA: Diagnosis not present

## 2023-03-06 DIAGNOSIS — R600 Localized edema: Secondary | ICD-10-CM | POA: Diagnosis not present

## 2023-03-06 DIAGNOSIS — I10 Essential (primary) hypertension: Secondary | ICD-10-CM | POA: Diagnosis not present

## 2023-03-19 DIAGNOSIS — R600 Localized edema: Secondary | ICD-10-CM | POA: Diagnosis not present

## 2023-03-19 DIAGNOSIS — I10 Essential (primary) hypertension: Secondary | ICD-10-CM | POA: Diagnosis not present

## 2023-03-19 DIAGNOSIS — E1122 Type 2 diabetes mellitus with diabetic chronic kidney disease: Secondary | ICD-10-CM | POA: Diagnosis not present

## 2023-03-19 DIAGNOSIS — R8290 Unspecified abnormal findings in urine: Secondary | ICD-10-CM | POA: Diagnosis not present

## 2023-03-19 DIAGNOSIS — N1831 Chronic kidney disease, stage 3a: Secondary | ICD-10-CM | POA: Diagnosis not present

## 2023-04-24 ENCOUNTER — Other Ambulatory Visit: Payer: Self-pay

## 2023-04-24 ENCOUNTER — Other Ambulatory Visit: Payer: HMO

## 2023-04-24 DIAGNOSIS — C61 Malignant neoplasm of prostate: Secondary | ICD-10-CM

## 2023-04-25 LAB — PSA: Prostate Specific Ag, Serum: 8.2 ng/mL — ABNORMAL HIGH (ref 0.0–4.0)

## 2023-04-28 ENCOUNTER — Other Ambulatory Visit: Payer: Self-pay

## 2023-04-28 ENCOUNTER — Telehealth: Payer: Self-pay | Admitting: Urology

## 2023-04-28 DIAGNOSIS — C61 Malignant neoplasm of prostate: Secondary | ICD-10-CM

## 2023-04-28 NOTE — Telephone Encounter (Signed)
Mrs Vanhaitsma advised. See lab result note.

## 2023-04-28 NOTE — Telephone Encounter (Signed)
Pt returned your call.  

## 2023-04-28 NOTE — Telephone Encounter (Signed)
Left message to call back, putting information below  Vanna Scotland, MD  Domingo Cocking V, CMA PSA has jumped up a good bit in the past 6 months.  Lets plan to recheck it in 3 months from now and if it still going up, may need either repeat biopsy versus repeat MRI.  Please schedule lab visit.  Vanna Scotland, MD

## 2023-05-17 ENCOUNTER — Other Ambulatory Visit: Payer: Self-pay | Admitting: Urology

## 2023-05-17 DIAGNOSIS — C61 Malignant neoplasm of prostate: Secondary | ICD-10-CM

## 2023-05-17 DIAGNOSIS — N189 Chronic kidney disease, unspecified: Secondary | ICD-10-CM

## 2023-05-17 DIAGNOSIS — Q600 Renal agenesis, unilateral: Secondary | ICD-10-CM

## 2023-05-17 DIAGNOSIS — Z87442 Personal history of urinary calculi: Secondary | ICD-10-CM

## 2023-05-17 DIAGNOSIS — N400 Enlarged prostate without lower urinary tract symptoms: Secondary | ICD-10-CM

## 2023-05-27 DIAGNOSIS — H2513 Age-related nuclear cataract, bilateral: Secondary | ICD-10-CM | POA: Diagnosis not present

## 2023-05-27 DIAGNOSIS — I10 Essential (primary) hypertension: Secondary | ICD-10-CM | POA: Diagnosis not present

## 2023-05-27 DIAGNOSIS — H25013 Cortical age-related cataract, bilateral: Secondary | ICD-10-CM | POA: Diagnosis not present

## 2023-06-09 DIAGNOSIS — E1122 Type 2 diabetes mellitus with diabetic chronic kidney disease: Secondary | ICD-10-CM | POA: Diagnosis not present

## 2023-06-09 DIAGNOSIS — E782 Mixed hyperlipidemia: Secondary | ICD-10-CM | POA: Diagnosis not present

## 2023-06-09 DIAGNOSIS — N1831 Chronic kidney disease, stage 3a: Secondary | ICD-10-CM | POA: Diagnosis not present

## 2023-06-16 DIAGNOSIS — I10 Essential (primary) hypertension: Secondary | ICD-10-CM | POA: Diagnosis not present

## 2023-06-16 DIAGNOSIS — M1A09X0 Idiopathic chronic gout, multiple sites, without tophus (tophi): Secondary | ICD-10-CM | POA: Diagnosis not present

## 2023-06-16 DIAGNOSIS — E1122 Type 2 diabetes mellitus with diabetic chronic kidney disease: Secondary | ICD-10-CM | POA: Diagnosis not present

## 2023-06-16 DIAGNOSIS — E782 Mixed hyperlipidemia: Secondary | ICD-10-CM | POA: Diagnosis not present

## 2023-06-16 DIAGNOSIS — N1831 Chronic kidney disease, stage 3a: Secondary | ICD-10-CM | POA: Diagnosis not present

## 2023-06-16 DIAGNOSIS — C61 Malignant neoplasm of prostate: Secondary | ICD-10-CM | POA: Diagnosis not present

## 2023-06-16 DIAGNOSIS — K219 Gastro-esophageal reflux disease without esophagitis: Secondary | ICD-10-CM | POA: Diagnosis not present

## 2023-06-16 DIAGNOSIS — N2581 Secondary hyperparathyroidism of renal origin: Secondary | ICD-10-CM | POA: Diagnosis not present

## 2023-07-07 DIAGNOSIS — R600 Localized edema: Secondary | ICD-10-CM | POA: Diagnosis not present

## 2023-07-07 DIAGNOSIS — N1832 Chronic kidney disease, stage 3b: Secondary | ICD-10-CM | POA: Diagnosis not present

## 2023-07-07 DIAGNOSIS — N2581 Secondary hyperparathyroidism of renal origin: Secondary | ICD-10-CM | POA: Diagnosis not present

## 2023-07-07 DIAGNOSIS — R809 Proteinuria, unspecified: Secondary | ICD-10-CM | POA: Diagnosis not present

## 2023-07-07 DIAGNOSIS — I10 Essential (primary) hypertension: Secondary | ICD-10-CM | POA: Diagnosis not present

## 2023-07-28 ENCOUNTER — Other Ambulatory Visit: Payer: HMO

## 2023-07-28 DIAGNOSIS — C61 Malignant neoplasm of prostate: Secondary | ICD-10-CM | POA: Diagnosis not present

## 2023-07-29 LAB — PSA: Prostate Specific Ag, Serum: 8.9 ng/mL — ABNORMAL HIGH (ref 0.0–4.0)

## 2023-10-17 ENCOUNTER — Other Ambulatory Visit: Payer: Self-pay

## 2023-10-20 ENCOUNTER — Other Ambulatory Visit: Payer: Self-pay

## 2023-10-20 DIAGNOSIS — C61 Malignant neoplasm of prostate: Secondary | ICD-10-CM

## 2023-10-21 LAB — PSA: Prostate Specific Ag, Serum: 10.3 ng/mL — ABNORMAL HIGH (ref 0.0–4.0)

## 2023-10-22 ENCOUNTER — Ambulatory Visit: Payer: HMO | Admitting: Urology

## 2023-10-22 VITALS — BP 163/82 | HR 68 | Ht 69.0 in | Wt 277.2 lb

## 2023-10-22 DIAGNOSIS — R972 Elevated prostate specific antigen [PSA]: Secondary | ICD-10-CM | POA: Diagnosis not present

## 2023-10-22 DIAGNOSIS — N400 Enlarged prostate without lower urinary tract symptoms: Secondary | ICD-10-CM

## 2023-10-22 DIAGNOSIS — C61 Malignant neoplasm of prostate: Secondary | ICD-10-CM

## 2023-10-22 NOTE — Patient Instructions (Signed)
 Prostate MRI Prep:  1- No ejaculation 48 hours prior to exam  2- No caffeine or carbonated beverages on day of the exam  3- Eat light diet evening prior and day of exam  4- Avoid eating 4 hours prior to exam  5- Fleets enema needs to be done 4 hours prior to exam -See below. Can be purchased at the drug store.

## 2023-10-22 NOTE — Progress Notes (Signed)
 Marcelle Overlie Plume,acting as a scribe for Vanna Scotland, MD.,have documented all relevant documentation on the behalf of Vanna Scotland, MD,as directed by  Vanna Scotland, MD while in the presence of Vanna Scotland, MD.  10/22/23 9:44 AM   Thomas Cooke [birth year] [id number]  Referring provider: Marina Goodell, MD [address] [place],  [place] [address]  Chief Complaint  Patient presents with   Prostate Cancer    HPI: 68 year-old male with a personal history of prostate cancer on active surveillance and elevated PSA.  He presents for follow-up regarding his PSA, which has increased from 8 last year to 10.3 on 10/20/2023.  He was diagnosed with Gleason 3+3 prostate cancer in 07/2021. He elected for active surveillance at that time. He had a prostate MRI in 04/27/2021 at [facility] that showed PIRADS 4 lesion in the left anterior extending to the mid prostate. He had a fusion biopsy at [facility] in the area of interest, which also represented Gleason 3+3 along with 4 additional cores. Prostate volume was 19 cc.  His PSA at the time of diagnosis was 4.1.    He had a repeat MRI of his prostate on 06/18/2022 at [facility] which showed a PIRADS 2 lesion.  Today, he reports no significant urinary issues, although he occasionally feels he does not completely empty his bladder. He describes his urinary stream as variable, sometimes strong, but occasionally resulting in dribbling. He typically wakes up once a night to urinate, though this can increase to three times depending on fluid intake. He drinks water and a sugar-free electrolyte drink daily. He has not tried using electrolyte packets in water. He has not had an MRI of his prostate in two years.   He is scheduled to see a nephrologist soon, as there is concern that some symptoms may be related to kidney function.    Prostate Specific Ag, Serum  Latest Ref Rng 0.0 - 4.0 ng/mL  10/22/2022 6.2 (H)   04/24/2023 8.2 (H)   07/28/2023 8.9 (H)   10/20/2023 10.3  (H)       Home Medications:  Allergies as of 10/22/2023       Reactions   Penicillins Rash        Medication List        Accurate as of October 22, 2023  9:44 AM. If you have any questions, ask your nurse or doctor.          allopurinol 100 MG tablet Commonly known as: ZYLOPRIM Take 1 tablet (100 mg total) by mouth daily.   amLODipine 10 MG tablet Commonly known as: NORVASC Take 10 mg by mouth daily.   atorvastatin 10 MG tablet Commonly known as: LIPITOR Take 10 mg by mouth at bedtime.   benazepril 40 MG tablet Commonly known as: LOTENSIN Take by mouth.   chlorthalidone 50 MG tablet Commonly known as: HYGROTON Take 50 mg by mouth daily.   hydrALAZINE 50 MG tablet Commonly known as: APRESOLINE Take 50 mg by mouth 2 (two) times daily.   Januvia 50 MG tablet Generic drug: sitaGLIPtin Take 50 mg by mouth daily.   metoprolol tartrate 100 MG tablet Commonly known as: LOPRESSOR Take 100 mg by mouth 2 (two) times daily. What changed: Another medication with the same name was removed. Continue taking this medication, and follow the directions you see here. Changed by: Vanna Scotland   pantoprazole 40 MG tablet Commonly known as: PROTONIX Take 40 mg by mouth daily.   Potassium Citrate  15 MEQ (1620 MG) Tbcr TAKE 1 TABLET BY MOUTH IN THE MORNING AND IN THE EVENING        Allergies:  Allergies  Allergen Reactions   Penicillins Rash    Social History:  reports that he has never smoked. He has never used smokeless tobacco. He reports that he does not drink alcohol and does not use drugs.   Physical Exam: BP (!) 163/82   Pulse 68   Ht 5\' 9"  (1.753 m)   Wt 277 lb 4 oz (125.8 kg)   BMI 40.94 kg/m   Constitutional:  Alert and oriented, No acute distress. HEENT:  AT, moist mucus membranes.  Trachea midline, no masses. GU: Prostate mildly enlarged but otherwise unremarkable Neurologic: Grossly intact, no focal deficits, moving all 4  extremities. Psychiatric: Normal mood and affect.   Assessment & Plan:    1. Prostate cancer/ Elevated PSA - Low risk on active surveillance  - PSA has increased from 8 to 10 over the past year - Differential diagnosis includes BPH, prostatitis, or prostate cancer.  - Given the increase, an MRI of the prostate is warranted to assess for any significant changes since the last imaging two years ago.  - If the MRI shows no significant changes, we will continue to monitor PSA levels. If there are concerning findings, a biopsy may be considered.  2. BPH with LUTS - He reports variable urinary stream and occasional nocturia, which may be related to BPH.  - DRE revealed a mildly enlarged prostate but was otherwise unremarkable.  - Continue monitoring symptoms and consider medication if symptoms worsen.  Return for prostate MRI, PSA labs in 6 months. Follow up with me in 1 year.  I have reviewed the above documentation for accuracy and completeness, and I agree with the above.   Vanna Scotland, MD   [facility] [address] [place], [place] [address] [phone]

## 2023-11-04 DIAGNOSIS — N1832 Chronic kidney disease, stage 3b: Secondary | ICD-10-CM | POA: Diagnosis not present

## 2023-11-04 DIAGNOSIS — I10 Essential (primary) hypertension: Secondary | ICD-10-CM | POA: Diagnosis not present

## 2023-11-04 DIAGNOSIS — R809 Proteinuria, unspecified: Secondary | ICD-10-CM | POA: Diagnosis not present

## 2023-11-07 ENCOUNTER — Ambulatory Visit
Admission: RE | Admit: 2023-11-07 | Discharge: 2023-11-07 | Disposition: A | Discharge status: Home / Self Care | Source: Ambulatory Visit | Attending: Urology | Admitting: Urology

## 2023-11-07 DIAGNOSIS — C61 Malignant neoplasm of prostate: Secondary | ICD-10-CM | POA: Insufficient documentation

## 2023-11-07 DIAGNOSIS — N4289 Other specified disorders of prostate: Secondary | ICD-10-CM | POA: Diagnosis not present

## 2023-11-07 DIAGNOSIS — K5730 Diverticulosis of large intestine without perforation or abscess without bleeding: Secondary | ICD-10-CM | POA: Diagnosis not present

## 2023-11-07 MED ORDER — GADOBUTROL 1 MMOL/ML IV SOLN
10.0000 mL | Freq: Once | INTRAVENOUS | Status: AC | PRN
Start: 2023-11-07 — End: 2023-11-07
  Administered 2023-11-07: 10 mL via INTRAVENOUS

## 2023-11-08 ENCOUNTER — Other Ambulatory Visit: Payer: Self-pay | Admitting: Urology

## 2023-11-08 DIAGNOSIS — N189 Chronic kidney disease, unspecified: Secondary | ICD-10-CM

## 2023-11-08 DIAGNOSIS — N400 Enlarged prostate without lower urinary tract symptoms: Secondary | ICD-10-CM

## 2023-11-08 DIAGNOSIS — Q600 Renal agenesis, unilateral: Secondary | ICD-10-CM

## 2023-11-08 DIAGNOSIS — C61 Malignant neoplasm of prostate: Secondary | ICD-10-CM

## 2023-11-08 DIAGNOSIS — Z87442 Personal history of urinary calculi: Secondary | ICD-10-CM

## 2023-11-11 DIAGNOSIS — N2581 Secondary hyperparathyroidism of renal origin: Secondary | ICD-10-CM | POA: Diagnosis not present

## 2023-11-11 DIAGNOSIS — E1122 Type 2 diabetes mellitus with diabetic chronic kidney disease: Secondary | ICD-10-CM | POA: Diagnosis not present

## 2023-11-11 DIAGNOSIS — R809 Proteinuria, unspecified: Secondary | ICD-10-CM | POA: Diagnosis not present

## 2023-11-11 DIAGNOSIS — N1832 Chronic kidney disease, stage 3b: Secondary | ICD-10-CM | POA: Diagnosis not present

## 2023-11-11 DIAGNOSIS — R600 Localized edema: Secondary | ICD-10-CM | POA: Diagnosis not present

## 2023-11-11 DIAGNOSIS — I10 Essential (primary) hypertension: Secondary | ICD-10-CM | POA: Diagnosis not present

## 2023-12-01 ENCOUNTER — Telehealth: Payer: Self-pay | Admitting: Urology

## 2023-12-01 NOTE — Telephone Encounter (Signed)
 Patient's wife Mayola Specking called requesting a call back in regards to his MRI. She states that she has read something within his results about Diverticulitis but does not see anything regarding his PSA and would like clarification. Patient's wife can be reached at [phone]. Please advise.

## 2023-12-04 ENCOUNTER — Encounter: Payer: Self-pay | Admitting: Urology

## 2023-12-10 DIAGNOSIS — N1831 Chronic kidney disease, stage 3a: Secondary | ICD-10-CM | POA: Diagnosis not present

## 2023-12-10 DIAGNOSIS — E782 Mixed hyperlipidemia: Secondary | ICD-10-CM | POA: Diagnosis not present

## 2023-12-10 DIAGNOSIS — E1122 Type 2 diabetes mellitus with diabetic chronic kidney disease: Secondary | ICD-10-CM | POA: Diagnosis not present

## 2023-12-18 DIAGNOSIS — K219 Gastro-esophageal reflux disease without esophagitis: Secondary | ICD-10-CM | POA: Diagnosis not present

## 2023-12-18 DIAGNOSIS — I10 Essential (primary) hypertension: Secondary | ICD-10-CM | POA: Diagnosis not present

## 2023-12-18 DIAGNOSIS — N2581 Secondary hyperparathyroidism of renal origin: Secondary | ICD-10-CM | POA: Diagnosis not present

## 2023-12-18 DIAGNOSIS — E782 Mixed hyperlipidemia: Secondary | ICD-10-CM | POA: Diagnosis not present

## 2023-12-18 DIAGNOSIS — Z6841 Body Mass Index (BMI) 40.0 and over, adult: Secondary | ICD-10-CM | POA: Diagnosis not present

## 2023-12-18 DIAGNOSIS — M1A09X0 Idiopathic chronic gout, multiple sites, without tophus (tophi): Secondary | ICD-10-CM | POA: Diagnosis not present

## 2023-12-18 DIAGNOSIS — N1831 Chronic kidney disease, stage 3a: Secondary | ICD-10-CM | POA: Diagnosis not present

## 2023-12-18 DIAGNOSIS — C61 Malignant neoplasm of prostate: Secondary | ICD-10-CM | POA: Diagnosis not present

## 2023-12-18 DIAGNOSIS — E1122 Type 2 diabetes mellitus with diabetic chronic kidney disease: Secondary | ICD-10-CM | POA: Diagnosis not present

## 2023-12-18 DIAGNOSIS — Z0000 Encounter for general adult medical examination without abnormal findings: Secondary | ICD-10-CM | POA: Diagnosis not present

## 2023-12-20 ENCOUNTER — Other Ambulatory Visit: Payer: Self-pay | Admitting: Urology

## 2023-12-20 DIAGNOSIS — N400 Enlarged prostate without lower urinary tract symptoms: Secondary | ICD-10-CM

## 2023-12-20 DIAGNOSIS — Z87442 Personal history of urinary calculi: Secondary | ICD-10-CM

## 2023-12-20 DIAGNOSIS — Q600 Renal agenesis, unilateral: Secondary | ICD-10-CM

## 2023-12-20 DIAGNOSIS — N189 Chronic kidney disease, unspecified: Secondary | ICD-10-CM

## 2023-12-20 DIAGNOSIS — C61 Malignant neoplasm of prostate: Secondary | ICD-10-CM

## 2024-01-14 DIAGNOSIS — R7989 Other specified abnormal findings of blood chemistry: Secondary | ICD-10-CM | POA: Diagnosis not present

## 2024-02-02 ENCOUNTER — Telehealth: Payer: Self-pay | Admitting: *Deleted

## 2024-02-02 ENCOUNTER — Other Ambulatory Visit: Payer: Self-pay | Admitting: Urology

## 2024-02-02 MED ORDER — DIAZEPAM 10 MG PO TABS: 10.0000 mg | ORAL_TABLET | Freq: Once | ORAL | 0 refills | Status: AC | PRN

## 2024-02-02 NOTE — Telephone Encounter (Signed)
 Please send in valium to [facility] for fusion biopsy.

## 2024-02-04 ENCOUNTER — Ambulatory Visit: Admitting: Urology

## 2024-02-04 VITALS — BP 131/80 | HR 66 | Ht 69.0 in | Wt 277.0 lb

## 2024-02-04 DIAGNOSIS — Z792 Long term (current) use of antibiotics: Secondary | ICD-10-CM

## 2024-02-04 DIAGNOSIS — Z2989 Encounter for other specified prophylactic measures: Secondary | ICD-10-CM

## 2024-02-04 DIAGNOSIS — N4231 Prostatic intraepithelial neoplasia: Secondary | ICD-10-CM | POA: Diagnosis not present

## 2024-02-04 DIAGNOSIS — R9389 Abnormal findings on diagnostic imaging of other specified body structures: Secondary | ICD-10-CM | POA: Diagnosis not present

## 2024-02-04 DIAGNOSIS — C61 Malignant neoplasm of prostate: Secondary | ICD-10-CM | POA: Diagnosis not present

## 2024-02-04 MED ORDER — GENTAMICIN SULFATE 40 MG/ML IJ SOLN
80.0000 mg | Freq: Once | INTRAMUSCULAR | Status: AC
Start: 2024-02-04 — End: 2024-02-04
  Administered 2024-02-04: 80 mg via INTRAMUSCULAR

## 2024-02-04 MED ORDER — LEVOFLOXACIN 500 MG PO TABS
500.0000 mg | ORAL_TABLET | Freq: Once | ORAL | Status: AC
  Administered 2024-02-04: 500 mg via ORAL

## 2024-02-04 NOTE — Patient Instructions (Signed)

## 2024-02-04 NOTE — Progress Notes (Signed)
   02/04/24  Indication: grade group 1 prostate cancer on active surveillance, new MRI lesions  MRI Fusion Prostate Biopsy Procedure   Informed consent was obtained, and we discussed the risks of bleeding and infection/sepsis. A time out was performed to ensure correct patient identity.  Pre-Procedure: - Last PSA Level: 10.3 - Gentamicin and levaquin given for antibiotic prophylaxis -Prostate measured 24 g on MRI, PSA density 0.43 -No median lobe.  Hypoechoic regions correlated with MRI findings  Procedure: - Prostate block performed using 10 cc 1% lidocaine  - MRI fusion biopsy was performed, and biopsies were taken from:  ROI#1 PIRADS4 lesion(3) ROI#2 PI-RADS 4 lesion(3)  - Standard biopsies taken, 6 under ultrasound guidance. - Total of 12 cores taken  Post-Procedure: - Patient tolerated the procedure well - He was counseled to seek immediate medical attention if experiences significant bleeding, fevers, or severe pain - Return in one week to discuss biopsy results  Assessment/ Plan: Will follow up in 1-2 weeks to discuss pathology, was.  He followed by Dr. Jhonny Moss, MD 02/04/2024

## 2024-02-17 ENCOUNTER — Ambulatory Visit: Admitting: Urology

## 2024-02-17 VITALS — BP 148/84 | HR 72 | Ht 69.0 in | Wt 271.0 lb

## 2024-02-17 DIAGNOSIS — N400 Enlarged prostate without lower urinary tract symptoms: Secondary | ICD-10-CM

## 2024-02-17 DIAGNOSIS — C61 Malignant neoplasm of prostate: Secondary | ICD-10-CM

## 2024-02-17 DIAGNOSIS — Z87442 Personal history of urinary calculi: Secondary | ICD-10-CM | POA: Diagnosis not present

## 2024-02-17 DIAGNOSIS — Q600 Renal agenesis, unilateral: Secondary | ICD-10-CM | POA: Diagnosis not present

## 2024-02-17 DIAGNOSIS — N189 Chronic kidney disease, unspecified: Secondary | ICD-10-CM | POA: Diagnosis not present

## 2024-02-17 MED ORDER — ALLOPURINOL 100 MG PO TABS: 100.0000 mg | ORAL_TABLET | Freq: Every day | ORAL | 3 refills | Status: AC

## 2024-02-17 MED ORDER — POTASSIUM CITRATE ER 15 MEQ (1620 MG) PO TBCR: 1.0000 | EXTENDED_RELEASE_TABLET | Freq: Two times a day (BID) | ORAL | 2 refills | Status: AC

## 2024-02-17 NOTE — Patient Instructions (Signed)
 Your prostate biopsy showed stable low risk prostate cancer, the same as your prostate biopsy in 2022.  This can continue to be safely monitored with active surveillance with low risk of progression or side effects.   Nocturia refers to the need to wake up during the night to urinate, which can disrupt your sleep and impact your overall well-being. Fortunately, there are several strategies you can employ to help prevent or manage nocturia. It's important to consult with your healthcare provider before making any significant changes to your routine. Here are some helpful strategies to consider:  Limit Fluid Intake Before Bed: Avoid drinking large amounts of fluids in the evening, especially within a few hours of bedtime. Consume most of your daily fluid intake earlier in the day to reduce the need to urinate at night.  Monitor Your Diet: Limit your intake of caffeine and alcohol, as these substances can increase urine production and irritate the bladder.  Avoid diet, zero calorie, and artificially sweetened drinks, especially sodas, in the afternoon or evening. Be mindful of consuming foods and drinks with high water content before bedtime, such as watermelon and herbal teas.  Time Your Medications: If you're taking medications that contribute to increased urination, consult your healthcare provider about adjusting the timing of these medications to minimize their impact during the night.  Practice Double Voiding: Before going to bed, make an effort to empty your bladder twice within a short period. This can help reduce the amount of urine left in your bladder before sleep.  Bladder Training: Gradually increase the time between bathroom visits during the day to train your bladder to hold larger volumes of urine. Over time, this can help reduce the frequency of nighttime awakenings to urinate.  Elevate Your Legs During the Day: Elevating your legs during the day can help minimize fluid  retention in your lower extremities, which might reduce nighttime urination.  Pelvic Floor Exercises: Strengthening your pelvic floor muscles through Kegel exercises can help improve bladder control and potentially reduce the urge to urinate at night.  Create a Relaxing Bedtime Routine: Stress and anxiety can exacerbate nocturia. Engage in calming activities before bed, such as reading, listening to soothing music, or practicing relaxation techniques.  Stay Active: Engage in regular physical activity, but avoid intense exercise close to bedtime, as this can increase your body's demand for fluids.  Maintain a Healthy Weight: Excess weight can compress the bladder and contribute to bladder and urinary issues. Aim to achieve and maintain a healthy weight through a balanced diet and regular exercise.  Remember that every individual is unique, and the effectiveness of these strategies may vary. It's important to work with your healthcare provider to develop a plan that suits your specific needs and addresses any underlying causes of nocturia.

## 2024-02-17 NOTE — Progress Notes (Signed)
   02/17/2024 8:14 AM   Thomas Cooke 1955/10/08 [id number]  Reason for visit: Low risk prostate cancer on active surveillance, discussed new prostate biopsy results, urinary symptoms, history of nephrolithiasis, solitary right kidney.  HPI: 68 year old male previously followed by Dr. Kassie as well as Dr. Penne before she left the practice.  His care was transferred to me.  He has a number of urologic issues including low risk prostate cancer on active surveillance, urinary symptoms and nocturia, history of nephrolithiasis currently on potassium citrate  and allopurinol , and a solitary right kidney.  He was originally diagnosed with low risk grade group 1 prostate cancer in December 2022 and opted for active surveillance.  This was originally diagnosed on MRI fusion biopsy at [facility], PSA at time of diagnosis was 4.1.  He had a steady increase in PSA over the last few years, most recently 10.3 in March 2025 which prompted a repeat MRI.  MRI March 2025 showed a 24 g prostate with 2 PI-RADS 4 lesions.  He underwent fusion biopsy with me on 02/04/2024 with 3 biopsies of each ROI, and 6 standard template biopsies.  Pathology notable for low-volume(2%) grade group 1 disease in ROI #1, benign ROI #2, and 3 cores of grade group 1 disease on the template biopsy with max core involvement of 33%.  We reviewed his pathology with no change in Gleason score, and active surveillance continues to be a very reasonable option.  He is in agreement.  Urinary symptoms are stable, he consumes large volumes of fluid with his history of kidney stones, not bothered enough to consider medications at this time.  Also mildly factorial with his morbid obesity and BMI of 40 and diabetes.  Denies any stone episodes in the last 8 to 9 years, continues on allopurinol  and potassium citrate  and has been stone free on those medications.  Solitary right kidney, continues to follow with nephrology, recent creatinine 1.8, EGFR  41.  Continue active surveillance for low risk prostate cancer, initial diagnosis 2022 Allopurinol  and potassium citrate  refilled for history of uric acid stones Behavioral strategies discussed regarding urinary symptoms and nocturia RTC 6 months PSA prior  Thomas JAYSON Burnet, MD  [facility] [address], [address] [place], [place] [address] [phone]

## 2024-03-02 ENCOUNTER — Encounter: Payer: Self-pay | Admitting: Urology

## 2024-03-18 DIAGNOSIS — E1122 Type 2 diabetes mellitus with diabetic chronic kidney disease: Secondary | ICD-10-CM | POA: Diagnosis not present

## 2024-03-18 DIAGNOSIS — R809 Proteinuria, unspecified: Secondary | ICD-10-CM | POA: Diagnosis not present

## 2024-03-18 DIAGNOSIS — N2581 Secondary hyperparathyroidism of renal origin: Secondary | ICD-10-CM | POA: Diagnosis not present

## 2024-03-18 DIAGNOSIS — R600 Localized edema: Secondary | ICD-10-CM | POA: Diagnosis not present

## 2024-03-18 DIAGNOSIS — N1832 Chronic kidney disease, stage 3b: Secondary | ICD-10-CM | POA: Diagnosis not present

## 2024-03-18 DIAGNOSIS — I10 Essential (primary) hypertension: Secondary | ICD-10-CM | POA: Diagnosis not present

## 2024-03-25 DIAGNOSIS — I10 Essential (primary) hypertension: Secondary | ICD-10-CM | POA: Diagnosis not present

## 2024-03-25 DIAGNOSIS — E1122 Type 2 diabetes mellitus with diabetic chronic kidney disease: Secondary | ICD-10-CM | POA: Diagnosis not present

## 2024-03-25 DIAGNOSIS — R809 Proteinuria, unspecified: Secondary | ICD-10-CM | POA: Diagnosis not present

## 2024-03-25 DIAGNOSIS — N1832 Chronic kidney disease, stage 3b: Secondary | ICD-10-CM | POA: Diagnosis not present

## 2024-03-25 DIAGNOSIS — N2581 Secondary hyperparathyroidism of renal origin: Secondary | ICD-10-CM | POA: Diagnosis not present

## 2024-04-20 ENCOUNTER — Other Ambulatory Visit: Payer: Self-pay

## 2024-04-20 ENCOUNTER — Other Ambulatory Visit

## 2024-04-20 DIAGNOSIS — N400 Enlarged prostate without lower urinary tract symptoms: Secondary | ICD-10-CM | POA: Diagnosis not present

## 2024-04-21 ENCOUNTER — Ambulatory Visit: Payer: Self-pay | Admitting: Urology

## 2024-04-21 LAB — PSA: Prostate Specific Ag, Serum: 12.2 ng/mL — ABNORMAL HIGH (ref 0.0–4.0)

## 2024-05-10 DIAGNOSIS — N2581 Secondary hyperparathyroidism of renal origin: Secondary | ICD-10-CM | POA: Diagnosis not present

## 2024-05-10 DIAGNOSIS — R600 Localized edema: Secondary | ICD-10-CM | POA: Diagnosis not present

## 2024-05-10 DIAGNOSIS — N1832 Chronic kidney disease, stage 3b: Secondary | ICD-10-CM | POA: Diagnosis not present

## 2024-05-10 DIAGNOSIS — E1122 Type 2 diabetes mellitus with diabetic chronic kidney disease: Secondary | ICD-10-CM | POA: Diagnosis not present

## 2024-05-10 DIAGNOSIS — I10 Essential (primary) hypertension: Secondary | ICD-10-CM | POA: Diagnosis not present

## 2024-05-10 DIAGNOSIS — R809 Proteinuria, unspecified: Secondary | ICD-10-CM | POA: Diagnosis not present

## 2024-05-24 DIAGNOSIS — H25013 Cortical age-related cataract, bilateral: Secondary | ICD-10-CM | POA: Diagnosis not present

## 2024-05-24 DIAGNOSIS — H5213 Myopia, bilateral: Secondary | ICD-10-CM | POA: Diagnosis not present

## 2024-05-24 DIAGNOSIS — I10 Essential (primary) hypertension: Secondary | ICD-10-CM | POA: Diagnosis not present

## 2024-05-24 DIAGNOSIS — H2513 Age-related nuclear cataract, bilateral: Secondary | ICD-10-CM | POA: Diagnosis not present

## 2024-06-08 ENCOUNTER — Ambulatory Visit: Admitting: Urology

## 2024-07-02 DIAGNOSIS — E1122 Type 2 diabetes mellitus with diabetic chronic kidney disease: Secondary | ICD-10-CM | POA: Diagnosis not present

## 2024-07-02 DIAGNOSIS — E782 Mixed hyperlipidemia: Secondary | ICD-10-CM | POA: Diagnosis not present

## 2024-07-02 DIAGNOSIS — N1831 Chronic kidney disease, stage 3a: Secondary | ICD-10-CM | POA: Diagnosis not present

## 2024-07-09 DIAGNOSIS — E1122 Type 2 diabetes mellitus with diabetic chronic kidney disease: Secondary | ICD-10-CM | POA: Diagnosis not present

## 2024-07-09 DIAGNOSIS — Z1331 Encounter for screening for depression: Secondary | ICD-10-CM | POA: Diagnosis not present

## 2024-07-09 DIAGNOSIS — E782 Mixed hyperlipidemia: Secondary | ICD-10-CM | POA: Diagnosis not present

## 2024-07-09 DIAGNOSIS — I10 Essential (primary) hypertension: Secondary | ICD-10-CM | POA: Diagnosis not present

## 2024-07-09 DIAGNOSIS — N2581 Secondary hyperparathyroidism of renal origin: Secondary | ICD-10-CM | POA: Diagnosis not present

## 2024-07-09 DIAGNOSIS — M1A09X0 Idiopathic chronic gout, multiple sites, without tophus (tophi): Secondary | ICD-10-CM | POA: Diagnosis not present

## 2024-07-09 DIAGNOSIS — K219 Gastro-esophageal reflux disease without esophagitis: Secondary | ICD-10-CM | POA: Diagnosis not present

## 2024-07-09 DIAGNOSIS — N1832 Chronic kidney disease, stage 3b: Secondary | ICD-10-CM | POA: Diagnosis not present

## 2024-07-09 DIAGNOSIS — C61 Malignant neoplasm of prostate: Secondary | ICD-10-CM | POA: Diagnosis not present

## 2024-08-20 ENCOUNTER — Ambulatory Visit: Admitting: Urology

## 2024-08-24 NOTE — Progress Notes (Signed)
 Thomas Cooke                                          MRN: [id number]   08/24/2024   The [facility] Quality Team Specialist reviewed this patient medical record for the purposes of chart review for care gap closure. The following were reviewed: abstraction for care gap closure-controlling blood pressure.    [facility] Quality Team

## 2024-08-26 ENCOUNTER — Ambulatory Visit: Admitting: Urology

## 2024-10-14 ENCOUNTER — Other Ambulatory Visit

## 2024-10-19 ENCOUNTER — Ambulatory Visit: Admitting: Urology

## 2024-10-20 ENCOUNTER — Ambulatory Visit: Admitting: Urology
